# Patient Record
Sex: Male | Born: 2000 | Race: White | Hispanic: No | Marital: Single | Smoking: Current every day smoker
Health system: Southern US, Community
[De-identification: ages and names within clinical notes are randomized; demographics above are authoritative.]

## PROBLEM LIST (undated history)

## (undated) DIAGNOSIS — J302 Other seasonal allergic rhinitis: Secondary | ICD-10-CM

## (undated) DIAGNOSIS — F319 Bipolar disorder, unspecified: Secondary | ICD-10-CM

---
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
## 2018-01-26 ENCOUNTER — Emergency Department (HOSPITAL_BASED_OUTPATIENT_CLINIC_OR_DEPARTMENT_OTHER): Payer: PRIVATE HEALTH INSURANCE

## 2018-01-26 ENCOUNTER — Other Ambulatory Visit: Payer: Self-pay

## 2018-01-26 ENCOUNTER — Emergency Department (HOSPITAL_BASED_OUTPATIENT_CLINIC_OR_DEPARTMENT_OTHER)
Admission: EM | Admit: 2018-01-26 | Discharge: 2018-01-26 | Disposition: A | Discharge status: Home / Self Care | Payer: PRIVATE HEALTH INSURANCE | Attending: Physician Assistant | Admitting: Physician Assistant

## 2018-01-26 ENCOUNTER — Encounter (HOSPITAL_BASED_OUTPATIENT_CLINIC_OR_DEPARTMENT_OTHER): Payer: Self-pay

## 2018-01-26 DIAGNOSIS — F17210 Nicotine dependence, cigarettes, uncomplicated: Secondary | ICD-10-CM | POA: Diagnosis not present

## 2018-01-26 DIAGNOSIS — Z79899 Other long term (current) drug therapy: Secondary | ICD-10-CM | POA: Diagnosis not present

## 2018-01-26 DIAGNOSIS — R51 Headache: Secondary | ICD-10-CM | POA: Diagnosis not present

## 2018-01-26 DIAGNOSIS — R519 Headache, unspecified: Secondary | ICD-10-CM

## 2018-01-26 MED ORDER — SODIUM CHLORIDE 0.9 % IV BOLUS (SEPSIS)
1000.0000 mL | Freq: Once | INTRAVENOUS | Status: AC
  Administered 2018-01-26: 1000 mL via INTRAVENOUS

## 2018-01-26 MED ORDER — PROCHLORPERAZINE EDISYLATE 5 MG/ML IJ SOLN
10.0000 mg | Freq: Once | INTRAMUSCULAR | Status: AC
  Administered 2018-01-26: 10 mg via INTRAVENOUS
  Filled 2018-01-26: qty 2

## 2018-01-26 MED ORDER — DIPHENHYDRAMINE HCL 50 MG/ML IJ SOLN
25.0000 mg | Freq: Once | INTRAMUSCULAR | Status: AC
  Administered 2018-01-26: 25 mg via INTRAVENOUS
  Filled 2018-01-26: qty 1

## 2018-01-26 NOTE — ED Provider Notes (Signed)
[facility] EMERGENCY DEPARTMENT Provider Note   CSN: [id number] Arrival date & time: 01/26/18  1656     History   Chief Complaint Chief Complaint  Patient presents with  . Headache    HPI Ross Cooley is a 17 y.o. male.  HPI  Patient is an 17 year old male presenting with headache.  Patient has bipolar disorder.  Patient was unable to see his primary care so came here today.  Patient had a headache started this morning.  Has had headaches in the past.  He reports one is worse than usual.  Is located on his right eye.  He said it woke him up from sleep this morning.  He reports is been waxing and waning since then.  Was not related to exertion.   Past Medical History:  Diagnosis Date  . Bipolar 1 disorder (HCC)   . Seasonal allergies     There are no active problems to display for this patient.   History reviewed. No pertinent surgical history.     Home Medications    Prior to Admission medications   Medication Sig Start Date End Date Taking? Authorizing Provider  hydrOXYzine (ATARAX/VISTARIL) 25 MG tablet Take 1 tablet (25 mg total) by mouth every 6 (six) hours. Patient not taking: Reported on 10/30/2017 01/19/17   Bethel BornGekas, Kelly Marie, PA-C  lamoTRIgine (LAMICTAL) 100 MG tablet Take 50-100 mg See admin instructions by mouth. Take 1/2 tablet every morning and take 1 tablet at bedtime 10/24/17   [provider]  QUEtiapine (SEROQUEL XR) 300 MG 24 hr tablet Take 300 mg at bedtime by mouth. 09/25/17   [provider]  QUEtiapine (SEROQUEL) 25 MG tablet Take 25 mg daily as needed by mouth (for agitation).  10/24/17   [provider]    Family History No family history on file.  Social History Social History   Tobacco Use  . Smoking status: Current Every Day Smoker    Packs/day: 1.00    Types: Cigarettes  . Smokeless tobacco: Never Used  Substance Use Topics  . Alcohol use: No  . Drug use: No     Allergies   Patient  has no known allergies.   Review of Systems Review of Systems  Constitutional: Negative for activity change.  Respiratory: Negative for shortness of breath.   Cardiovascular: Negative for chest pain.  Gastrointestinal: Negative for abdominal pain.  Neurological: Positive for headaches. Negative for syncope and weakness.     Physical Exam Updated Vital Signs BP 108/81 (BP Location: Left Arm)   Pulse 75   Temp 98.3 F (36.8 C) (Oral)   Resp 18   Ht 5\' 8"  (1.727 m)   Wt 87.5 kg (192 lb 14.4 oz)   SpO2 96%   BMI 29.33 kg/m   Physical Exam  Constitutional: He is oriented to person, place, and time. He appears well-nourished.  HENT:  Head: Normocephalic.  Mouth/Throat: Oropharynx is clear and moist.  Eyes: Conjunctivae are normal.  Neck: No tracheal deviation present.  Cardiovascular: Normal rate.  Pulmonary/Chest: Effort normal. No stridor. No respiratory distress.  Abdominal: Soft. There is no tenderness. There is no guarding.  Musculoskeletal: Normal range of motion. He exhibits no edema.  Neurological: He is oriented to person, place, and time. No cranial nerve deficit.  Equal strength bilaterally upper and lower extremities n Normal sensation bilaterally. Speech comprehensible, no slurring. Facial nerve tested and appears grossly normal. Alert and oriented 3.   Skin: Skin is warm and dry. No  rash noted. He is not diaphoretic.  Psychiatric: He has a normal mood and affect. His behavior is normal.  Nursing note and vitals reviewed.    ED Treatments / Results  Labs (all labs ordered are listed, but only abnormal results are displayed) Labs Reviewed - No data to display  EKG  EKG Interpretation None       Radiology No results found.  Procedures Procedures (including critical care time)  Medications Ordered in ED Medications  sodium chloride 0.9 % bolus 1,000 mL (not administered)  prochlorperazine (COMPAZINE) injection 10 mg (not administered)    diphenhydrAMINE (BENADRYL) injection 25 mg (not administered)     Initial Impression / Assessment and Plan / ED Course  I have reviewed the triage vital signs and the nursing notes.  Pertinent labs & imaging results that were available during my care of the patient were reviewed by me and considered in my medical decision making (see chart for details).     Patient is an 17 year old male presenting with headache.  Patient has bipolar disorder.  Patient was unable to see his primary care so came here today.  Patient had a headache started this morning.  Has had headaches in the past.  He reports one is worse than usual.  Is located on his right eye.  He said it woke him up from sleep this morning.  He reports is been waxing and waning since then.  Was not related to exertion.   7:25 PM Will get CT.  Patient appears very comfortable.  Right now his headache is a 5-6 out of 10.  This would be very typical for subarachnoid hemorrhage.  In addition the waxing and waning pathology of this will be very atypical for subarachnoid hemorrhage.  We will get single CT.  We will give migraine medications.  Discussed subarachnoid hemorrhage and lumbar puncture.  Patient has very odd affect and difficulty making decisions with me.    I think given the atypical progress of symptoms we will not do a lumbar puncture at this time.   10:07 PM Went to [employer], patient ready to go. Tried to discuss with patient's father but he left the room. Both are anxious to leave. Patient's headahce now a .5 out of 10. Will have him keep daily headache journal and follow up with PCP.    Final Clinical Impressions(s) / ED Diagnoses   Final diagnoses:  None    ED Discharge Orders    None       Abelino Derrick, MD 01/26/18 2208

## 2018-01-26 NOTE — ED Notes (Signed)
Pt verbalizes understanding and denies any further needs at this time. 

## 2018-01-26 NOTE — ED Triage Notes (Signed)
C/o HA since 4am-denies injury-NAD-steady gait

## 2018-01-26 NOTE — Discharge Instructions (Signed)
Please keep a close eye on your headaches.  If you have any increasing headaches, increased pain, or any neurologic symptom,  please return immediately to the emergency department.  It is possible your headache was due to sinus fullness.  We want you to keep a daily record of your headaches and follow-up with your primary care.

## 2019-09-28 IMAGING — CT CT HEAD W/O CM
3 series · 14 of 47 positions shown, 16 images · non-contrast
Comparison: None.

CLINICAL DATA: Right-sided headache behind right eye.

EXAM:
CT HEAD WITHOUT CONTRAST
TECHNIQUE: Contiguous axial images were obtained from the base of the skull
through the vertex without intravenous contrast.

[Series 2: head wo · axial · 0.42mm/px · z∈[+821,+951]mm · 8 of 32 slices shown, 10 images]
[im 3/32  brain]
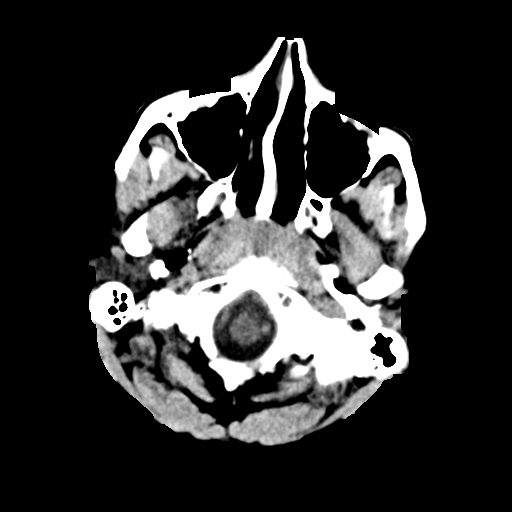
[im 3/32  bone]
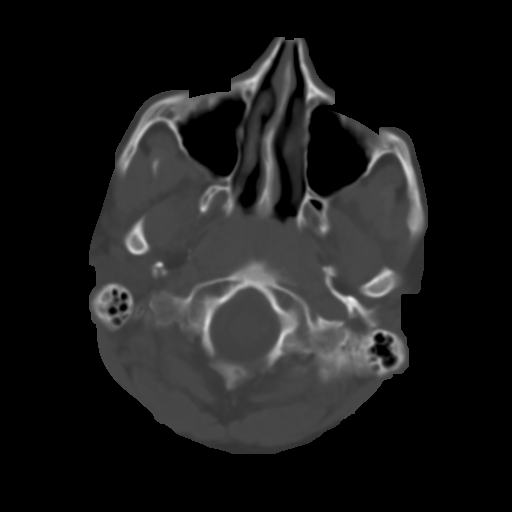
[im 7/32  brain]
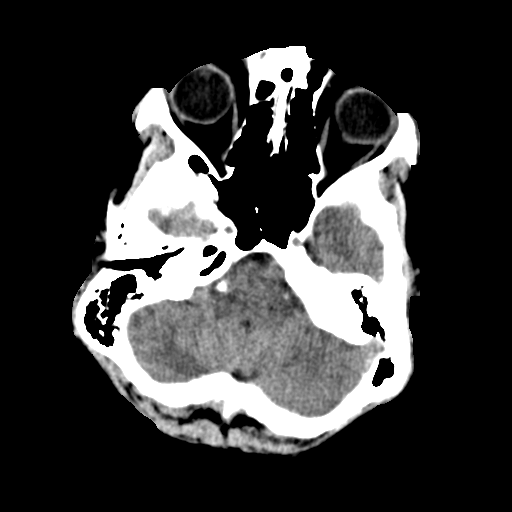
[im 10/32  brain]
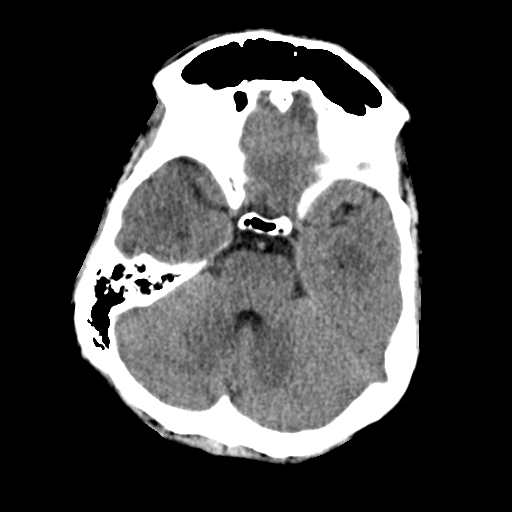
[im 14/32  brain]
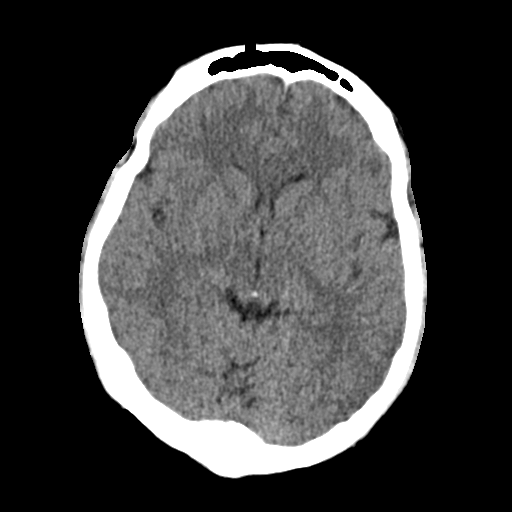
[im 18/32  brain]
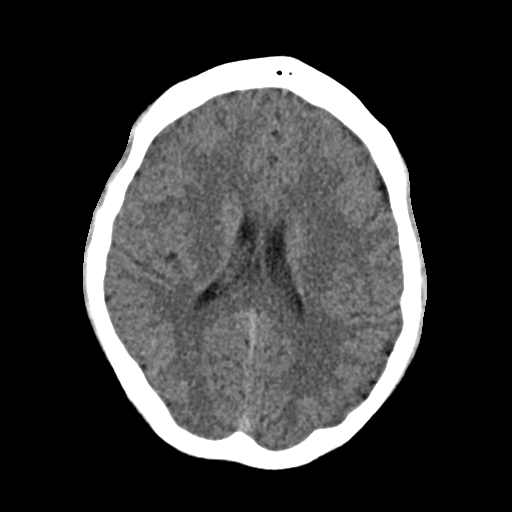
[im 18/32  bone]
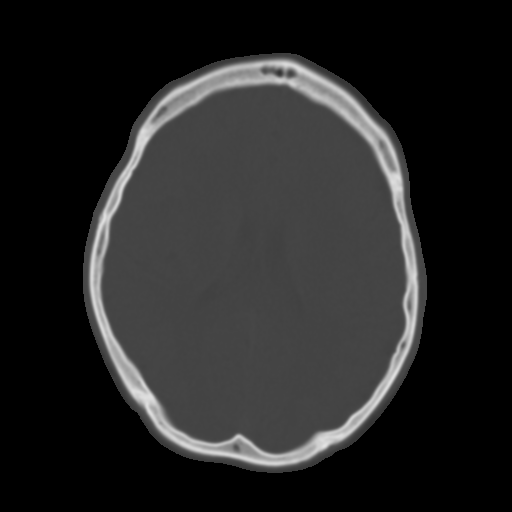
[im 22/32  brain]
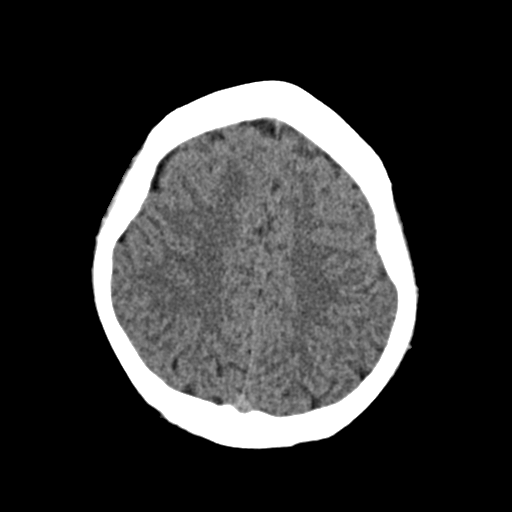
[im 25/32  brain]
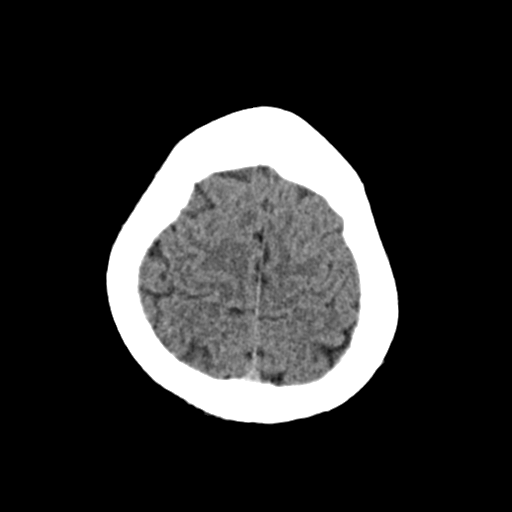
[im 29/32  brain]
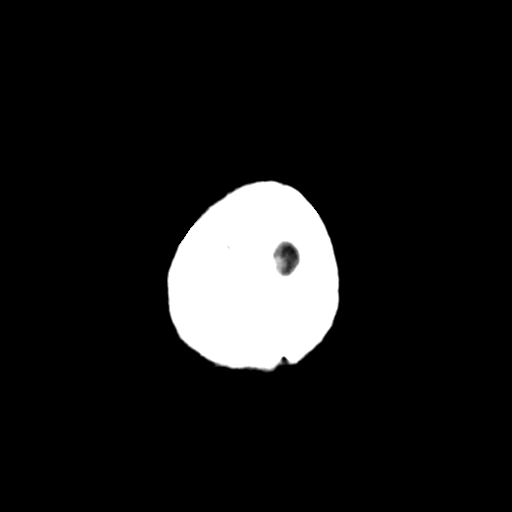

[Series 4: coronal soft · coronal · 0.32mm/px · 3 of 69 slices shown]
[im 23/69  brain]
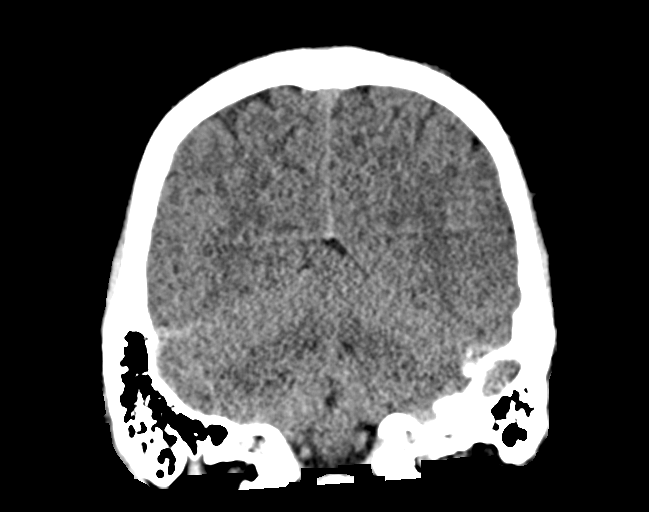
[im 31/69  brain]
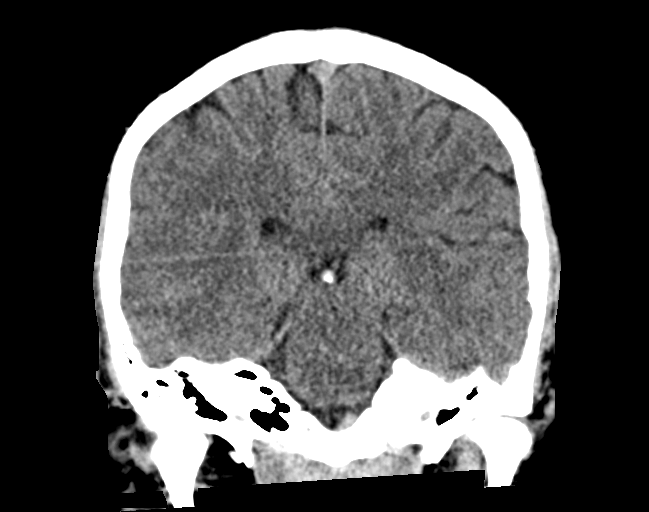
[im 38/69  brain]
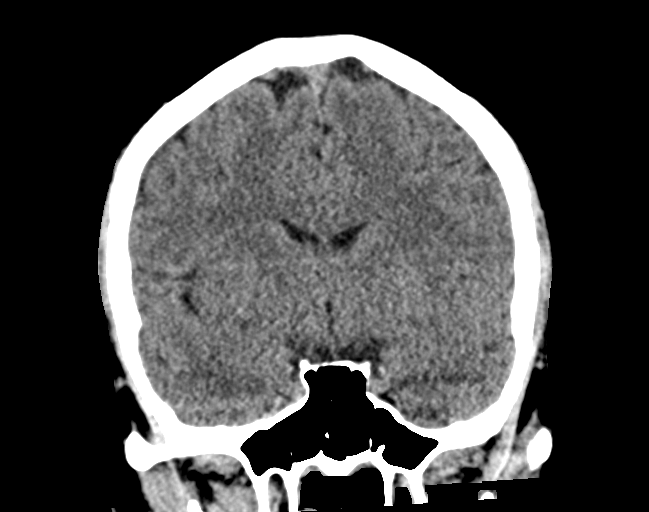

[Series 5: sag soft · sagittal · 0.30mm/px · 3 of 56 slices shown]
[im 19/56  brain]
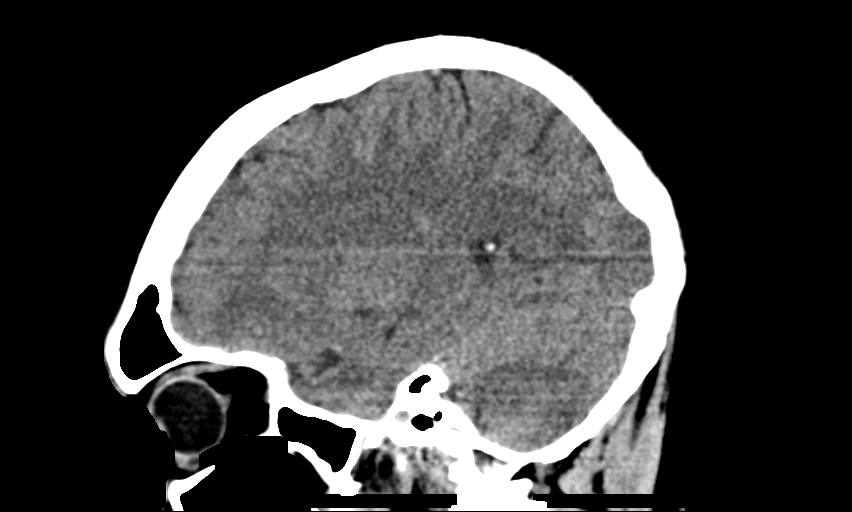
[im 28/56  brain]
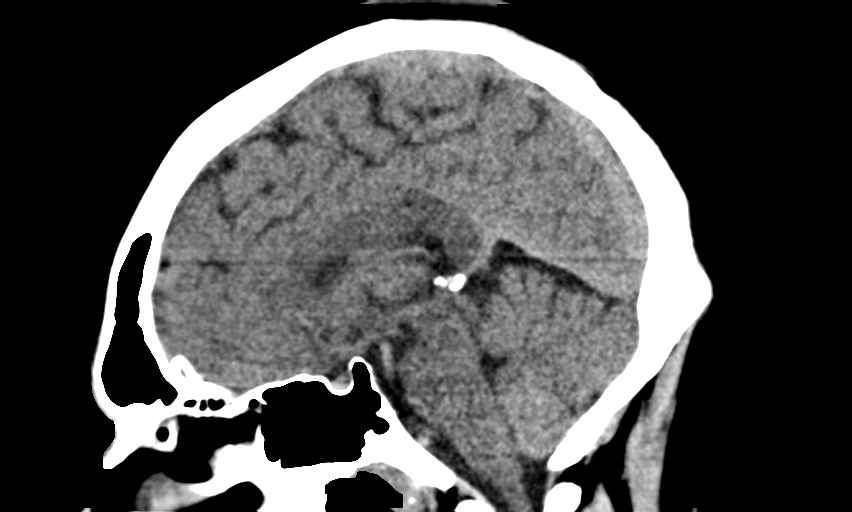
[im 37/56  brain]
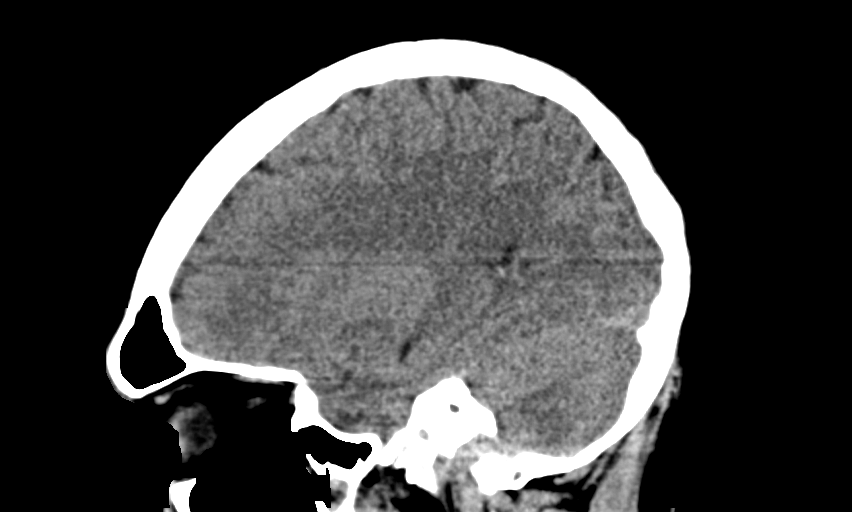

[14 of 47 positions shown; findings below may reference images not displayed]

FINDINGS: Brain: No evidence of acute infarction, hemorrhage, hydrocephalus,
extra-axial collection or mass lesion/mass effect.

Vascular: No hyperdense vessel or unexpected calcification.

Skull: Normal. Negative for fracture or focal lesion.

Sinuses/Orbits: Mild scattered mucosal thickening in the ethmoid air
cells. Paranasal sinuses, mastoid air cells, and middle ears are
otherwise normal.

Other: None.
IMPRESSION: 1. Mild scattered mucosal thickening in the ethmoid air cells. No
acute intracranial abnormality.

## 2020-12-31 ENCOUNTER — Emergency Department (HOSPITAL_COMMUNITY): Payer: Commercial Managed Care - PPO

## 2020-12-31 ENCOUNTER — Encounter (HOSPITAL_COMMUNITY): Payer: Self-pay

## 2020-12-31 ENCOUNTER — Emergency Department (HOSPITAL_COMMUNITY)
Admission: EM | Admit: 2020-12-31 | Discharge: 2020-12-31 | Disposition: A | Discharge status: Home / Self Care | Payer: Commercial Managed Care - PPO | Attending: Emergency Medicine | Admitting: Emergency Medicine

## 2020-12-31 ENCOUNTER — Other Ambulatory Visit: Payer: Self-pay

## 2020-12-31 DIAGNOSIS — R55 Syncope and collapse: Secondary | ICD-10-CM | POA: Diagnosis present

## 2020-12-31 DIAGNOSIS — F17210 Nicotine dependence, cigarettes, uncomplicated: Secondary | ICD-10-CM | POA: Diagnosis not present

## 2020-12-31 LAB — BASIC METABOLIC PANEL
Anion gap: 8 (ref 5–15)
BUN: 15 mg/dL (ref 6–20)
CO2: 26 mmol/L (ref 22–32)
Calcium: 9.3 mg/dL (ref 8.9–10.3)
Chloride: 104 mmol/L (ref 98–111)
Creatinine, Ser: 0.95 mg/dL (ref 0.61–1.24)
GFR, Estimated: >60 mL/min (ref >60)
Glucose, Bld: 92 mg/dL (ref 70–99)
Potassium: 3.9 mmol/L (ref 3.5–5.1)
Sodium: 138 mmol/L (ref 135–145)

## 2020-12-31 LAB — CBC WITH DIFFERENTIAL/PLATELET
Abs Immature Granulocytes: 0.05 10*3/uL (ref 0.00–0.07)
Basophils Absolute: 0 10*3/uL (ref 0.0–0.1)
Basophils Relative: 0 %
Eosinophils Absolute: 0.1 10*3/uL (ref 0.0–0.5)
Eosinophils Relative: 1 %
HCT: 40.7 % (ref 39.0–52.0)
Hemoglobin: 13.5 g/dL (ref 13.0–17.0)
Immature Granulocytes: 0 %
Lymphocytes Relative: 21 %
Lymphs Abs: 2.4 10*3/uL (ref 0.7–4.0)
MCH: 31.8 pg (ref 26.0–34.0)
MCHC: 33.2 g/dL (ref 30.0–36.0)
MCV: 95.8 fL (ref 80.0–100.0)
Monocytes Absolute: 0.8 10*3/uL (ref 0.1–1.0)
Monocytes Relative: 7 %
Neutro Abs: 8.1 10*3/uL — ABNORMAL HIGH (ref 1.7–7.7)
Neutrophils Relative %: 71 %
Platelets: 235 10*3/uL (ref 150–400)
RBC: 4.25 MIL/uL (ref 4.22–5.81)
RDW: 12.9 % (ref 11.5–15.5)
WBC: 11.3 10*3/uL — ABNORMAL HIGH (ref 4.0–10.5)
nRBC: 0 % (ref 0.0–0.2)

## 2020-12-31 LAB — TROPONIN I (HIGH SENSITIVITY): Troponin I (High Sensitivity): 13 ng/L (ref <18)

## 2020-12-31 NOTE — ED Triage Notes (Signed)
Pt arrived via walk in, states he  Does not remember fully, but was told, he passed out in car, EMS arrived, was not breathing, EMS did CPR on arrival. Pt states he took what he believed he took 30mg  oxy.

## 2020-12-31 NOTE — Discharge Instructions (Addendum)
Call your primary care doctor or specialist as discussed in the next 2-3 days.   Return immediately back to the ER if:  Your symptoms worsen within the next 12-24 hours. You develop new symptoms such as new fevers, persistent vomiting, new pain, shortness of breath, or new weakness or numbness, or if you have any other concerns.  

## 2020-12-31 NOTE — ED Provider Notes (Signed)
[facility] re[facility] facility. [facility]-EMERGENCY DEPT Provider Note   CSN: [id number] Arrival date & time: 12/31/20  1549     History No chief complaint on file.   Ross Cooley is a 20 y.o. male.  Patient presents to ER chief complaint of passing out.  Patient states that he does have a history of prescription drug abuse.  He had some oxycodone which he had snorted this prior to driving up to the gas station.  He had finished putting in his gas and sat back down in his car.  The next thing he knew he woke up with people surrounding him.  EMS reported that onlookers had performed CPR, however unclear whether this includes chest compressions or mouth-to-mouth or duration of allergic intervention.  Patient states that when he woke up he had no discomfort no headache no chest pain abdominal pain.  No recent history of fever vomiting cough or diarrhea.        Past Medical History:  Diagnosis Date  . Bipolar 1 disorder (HCC)   . Seasonal allergies     There are no problems to display for this patient.   History reviewed. No pertinent surgical history.     History reviewed. No pertinent family history.  Social History   Tobacco Use  . Smoking status: Current Every Day Smoker    Packs/day: 1.00    Types: Cigarettes  . Smokeless tobacco: Never Used  Substance Use Topics  . Alcohol use: No  . Drug use: No    Home Medications Prior to Admission medications   Medication Sig Start Date End Date Taking? Authorizing Provider  hydrOXYzine (ATARAX/VISTARIL) 25 MG tablet Take 1 tablet (25 mg total) by mouth every 6 (six) hours. Patient not taking: Reported on 10/30/2017 01/19/17   Bethel Born, PA-C  lamoTRIgine (LAMICTAL) 100 MG tablet Take 50-100 mg See admin instructions by mouth. Take 1/2 tablet every morning and take 1 tablet at bedtime 10/24/17   [provider]  QUEtiapine (SEROQUEL XR) 300 MG 24 hr tablet Take 300 mg at bedtime by mouth.  09/25/17   [provider]  QUEtiapine (SEROQUEL) 25 MG tablet Take 25 mg daily as needed by mouth (for agitation).  10/24/17   [provider]    Allergies    Patient has no known allergies.  Review of Systems   Review of Systems  Constitutional: Negative for fever.  HENT: Negative for ear pain and sore throat.   Eyes: Negative for pain.  Respiratory: Negative for cough.   Cardiovascular: Negative for chest pain.  Gastrointestinal: Negative for abdominal pain.  Genitourinary: Negative for flank pain.  Musculoskeletal: Negative for back pain.  Skin: Negative for color change and rash.  Neurological: Negative for syncope.  All other systems reviewed and are negative.   Physical Exam Updated Vital Signs BP 105/63   Pulse 75   Temp 97.6 F (36.4 C) (Oral)   Resp 18   SpO2 95%   Physical Exam Constitutional:      General: He is not in acute distress.    Appearance: He is well-developed.  HENT:     Head: Normocephalic.     Nose: Nose normal.  Eyes:     Extraocular Movements: Extraocular movements intact.  Cardiovascular:     Rate and Rhythm: Normal rate.  Pulmonary:     Effort: Pulmonary effort is normal.  Abdominal:     General: There is no distension.     Tenderness: There is  no abdominal tenderness.  Skin:    Coloration: Skin is not jaundiced.  Neurological:     General: No focal deficit present.     Mental Status: He is alert. Mental status is at baseline.     Cranial Nerves: No cranial nerve deficit.     Motor: No weakness.     Gait: Gait normal.     ED Results / Procedures / Treatments   Labs (all labs ordered are listed, but only abnormal results are displayed) Labs Reviewed  CBC WITH DIFFERENTIAL/PLATELET - Abnormal; Notable for the following components:      Result Value   WBC 11.3 (*)    Neutro Abs 8.1 (*)    All other components within normal limits  BASIC METABOLIC PANEL  TROPONIN I (HIGH SENSITIVITY)  TROPONIN I (HIGH  SENSITIVITY)    EKG None  Radiology DG Chest 2 View  Result Date: 12/31/2020 CLINICAL DATA:  Post CPR EXAM: CHEST - 2 VIEW COMPARISON:  None. FINDINGS: The heart size and mediastinal contours are within normal limits. Both lungs are clear. The visualized skeletal structures are unremarkable. IMPRESSION: No active cardiopulmonary disease. Electronically Signed   By: Jasmine Pang M.D.   On: 12/31/2020 19:15    Procedures Procedures (including critical care time)  Medications Ordered in ED Medications - No data to display  ED Course  I have reviewed the triage vital signs and the nursing notes.  Pertinent labs & imaging results that were available during my care of the patient were reviewed by me and considered in my medical decision making (see chart for details).    MDM Rules/Calculators/A&P                          Father was at bedside.  Denies any family history of sudden death.  Labs reviewed and was unremarkable.  Patient continues have no additional adverse events.  Father is at bedside and they are comfortable following up I advised immediate return if he has difficulty breathing pains or any additional concerns otherwise follow-up in drug rehab within the week.  Patient and family stressed understanding, discharged home in stable condition.   Final Clinical Impression(s) / ED Diagnoses Final diagnoses:  Syncope, unspecified syncope type    Rx / DC Orders ED Discharge Orders    None       Cheryll Cockayne, MD 12/31/20 1941

## 2021-01-28 ENCOUNTER — Encounter (HOSPITAL_COMMUNITY): Payer: Self-pay

## 2021-01-28 ENCOUNTER — Emergency Department (HOSPITAL_COMMUNITY)
Admission: EM | Admit: 2021-01-28 | Discharge: 2021-01-28 | Disposition: A | Discharge status: Home / Self Care | Payer: Commercial Managed Care - PPO | Attending: Emergency Medicine | Admitting: Emergency Medicine

## 2021-01-28 DIAGNOSIS — F17210 Nicotine dependence, cigarettes, uncomplicated: Secondary | ICD-10-CM | POA: Diagnosis not present

## 2021-01-28 DIAGNOSIS — R21 Rash and other nonspecific skin eruption: Secondary | ICD-10-CM | POA: Diagnosis present

## 2021-01-28 DIAGNOSIS — L509 Urticaria, unspecified: Secondary | ICD-10-CM | POA: Insufficient documentation

## 2021-01-28 MED ORDER — EPINEPHRINE 0.3 MG/0.3ML IJ SOAJ: 0.3000 mg | INTRAMUSCULAR | 0 refills | Status: AC | PRN

## 2021-01-28 MED ORDER — FAMOTIDINE 20 MG PO TABS: 20.0000 mg | ORAL_TABLET | Freq: Every day | ORAL | 0 refills | Status: AC

## 2021-01-28 MED ORDER — DIPHENHYDRAMINE HCL 25 MG PO TABS: 25.0000 mg | ORAL_TABLET | Freq: Four times a day (QID) | ORAL | 0 refills | Status: AC

## 2021-01-28 MED ORDER — FAMOTIDINE 20 MG PO TABS
20.0000 mg | ORAL_TABLET | Freq: Once | ORAL | Status: AC
  Administered 2021-01-28: 20 mg via ORAL
  Filled 2021-01-28: qty 1

## 2021-01-28 NOTE — ED Triage Notes (Signed)
Pt reports full body rash/hives that woke him up this morning. Pt took 25mg  benadryl PTA, c/o itching and discomfort, denies any respiratory symptoms

## 2021-01-28 NOTE — ED Notes (Signed)
Patient given drinks.  

## 2021-01-28 NOTE — ED Provider Notes (Signed)
[facility]-EMERGENCY DEPT Provider Note  CSN: [id number] Arrival date & time: 01/28/21 7510  Chief Complaint(s) Rash  HPI Ross Cooley is a 20 y.o. male    Rash Location:  Full body Quality: itchiness and redness   Severity:  Moderate Onset quality:  Gradual Duration:  1 day Timing:  Constant Progression:  Spreading Chronicity:  New Context comment:  Possible detergent or cleaning products. His GF moved into new apartement and he spent the night there last night. Relieved by:  Antihistamines Worsened by:  Nothing Associated symptoms: no fever, no headaches, no hoarse voice, no nausea, no periorbital edema, no shortness of breath, no throat swelling, no tongue swelling, not vomiting and not wheezing     Past Medical History Past Medical History:  Diagnosis Date  . Bipolar 1 disorder (HCC)   . Seasonal allergies    There are no problems to display for this patient.  Home Medication(s) Prior to Admission medications   Medication Sig Start Date End Date Taking? Authorizing Provider  diphenhydrAMINE (BENADRYL) 25 MG tablet Take 1 tablet (25 mg total) by mouth every 6 (six) hours for 5 days. 01/28/21 02/02/21 Yes Sylvi Rybolt, Amadeo Garnet, MD  EPINEPHrine 0.3 mg/0.3 mL IJ SOAJ injection Inject 0.3 mg into the muscle as needed for anaphylaxis. 01/28/21  Yes Coltyn Hanning, Amadeo Garnet, MD  famotidine (PEPCID) 20 MG tablet Take 1 tablet (20 mg total) by mouth daily for 5 days. 01/28/21 02/02/21 Yes Sobia Karger, Amadeo Garnet, MD  hydrOXYzine (ATARAX/VISTARIL) 25 MG tablet Take 1 tablet (25 mg total) by mouth every 6 (six) hours. Patient not taking: Reported on 10/30/2017 01/19/17   Bethel Born, PA-C  lamoTRIgine (LAMICTAL) 100 MG tablet Take 50-100 mg See admin instructions by mouth. Take 1/2 tablet every morning and take 1 tablet at bedtime 10/24/17   [provider]  QUEtiapine (SEROQUEL XR) 300 MG 24 hr tablet Take 300 mg at bedtime by mouth. 09/25/17   [provider]  QUEtiapine (SEROQUEL) 25 MG tablet Take 25 mg daily as needed by mouth (for agitation).  10/24/17   [provider]                                                                                                                                    Past Surgical History History reviewed. No pertinent surgical history. Family History History reviewed. No pertinent family history.  Social History Social History   Tobacco Use  . Smoking status: Current Every Day Smoker    Packs/day: 1.00    Types: Cigarettes  . Smokeless tobacco: Never Used  Substance Use Topics  . Alcohol use: No  . Drug use: No   Allergies Patient has no known allergies.  Review of Systems Review of Systems  Constitutional: Negative for fever.  HENT: Negative for hoarse voice.   Respiratory: Negative for shortness of breath and wheezing.   Gastrointestinal: Negative for nausea  and vomiting.  Skin: Positive for rash.  Neurological: Negative for headaches.   All other systems are reviewed and are negative for acute change except as noted in the HPI  Physical Exam Vital Signs  I have reviewed the triage vital signs BP 110/75   Pulse 61   Temp 97.7 F (36.5 C) (Oral)   Resp 15   Ht 5\' 8"  (1.727 m)   Wt 72.6 kg   SpO2 98%   BMI 24.33 kg/m   Physical Exam Vitals reviewed.  Constitutional:      General: He is not in acute distress.    Appearance: He is well-developed and well-nourished. He is not diaphoretic.  HENT:     Head: Normocephalic and atraumatic.     Nose: Nose normal.  Eyes:     General: No scleral icterus.       Right eye: No discharge.        Left eye: No discharge.     Extraocular Movements: EOM normal.     Conjunctiva/sclera: Conjunctivae normal.     Pupils: Pupils are equal, round, and reactive to light.  Cardiovascular:     Rate and Rhythm: Normal rate and regular rhythm.     Heart sounds: No murmur heard. No friction rub. No gallop.   Pulmonary:      Effort: Pulmonary effort is normal. No respiratory distress.     Breath sounds: Normal breath sounds. No stridor. No rales.  Abdominal:     General: There is no distension.     Palpations: Abdomen is soft.     Tenderness: There is no abdominal tenderness.  Musculoskeletal:        General: No tenderness or edema.     Cervical back: Normal range of motion and neck supple.  Skin:    General: Skin is warm and dry.     Findings: Rash present. No erythema. Rash is urticarial (mild, throughout torso, BUE and BLE).  Neurological:     Mental Status: He is alert and oriented to person, place, and time.  Psychiatric:        Mood and Affect: Mood and affect normal.     ED Results and Treatments Labs (all labs ordered are listed, but only abnormal results are displayed) Labs Reviewed - No data to display                                                                                                                       EKG  EKG Interpretation  Date/Time:    Ventricular Rate:    PR Interval:    QRS Duration:   QT Interval:    QTC Calculation:   R Axis:     Text Interpretation:        Radiology No results found.  Pertinent labs & imaging results that were available during my care of the patient were reviewed by me and considered in my medical decision making (see chart for details).  Medications Ordered in ED Medications  famotidine (PEPCID) tablet 20 mg (has no administration in time range)                                                                                                                                    Procedures Procedures  (including critical care time)  Medical Decision Making / ED Course I have reviewed the nursing notes for this encounter and the patient's prior records (if available in EHR or on provided paperwork).   ZEBBIE ACE was evaluated in Emergency Department on 01/28/2021 for the symptoms described in the history of present illness. He was  evaluated in the context of the global COVID-19 pandemic, which necessitated consideration that the patient might be at risk for infection with the SARS-CoV-2 virus that causes COVID-19. Institutional protocols and algorithms that pertain to the evaluation of patients at risk for COVID-19 are in a state of rapid change based on information released by regulatory bodies including the CDC and federal and state organizations. These policies and algorithms were followed during the patient's care in the ED.  20 y.o. male here with pruritic rash. No known triggers or exposures. No respiratory, GI, or neurologic symptoms to suggest anaphylaxis. NO recent infectious symptoms suggestive of viral urticaria.  Patient has taken benadryl prior to arrival.   On exam, there is no evidence of oral swelling or airway compromise.   Given H2 blocker. Monitored for 2 additional hours. Symptoms without progression.  Safe for discharge with strict return precautions. Given Rx for H2 blocker.       Final Clinical Impression(s) / ED Diagnoses Final diagnoses:  Urticaria   The patient appears reasonably screened and/or stabilized for discharge and I doubt any other medical condition or other [facility] requiring further screening, evaluation, or treatment in the ED at this time prior to discharge. Safe for discharge with strict return precautions.  Disposition: Discharge  Condition: Good  I have discussed the results, Dx and Tx plan with the patient/family who expressed understanding and agree(s) with the plan. Discharge instructions discussed at length. The patient/family was given strict return precautions who verbalized understanding of the instructions. No further questions at time of discharge.    ED Discharge Orders         Ordered    famotidine (PEPCID) 20 MG tablet  Daily        01/28/21 0535    diphenhydrAMINE (BENADRYL) 25 MG tablet  Every 6 hours        01/28/21 0535    EPINEPHrine 0.3 mg/0.3 mL IJ SOAJ  injection  As needed        01/28/21 0535            Follow Up: Primary care provider  Call  To schedule an appointment for close follow up      This chart was dictated using voice recognition software.  Despite best efforts to proofread,  errors can occur which can  change the documentation meaning.   Nira Conn, MD 01/28/21 [phone]

## 2022-09-02 IMAGING — CR DG CHEST 2V
2 series · 2 of 2 positions shown · non-contrast
Comparison: None.

CLINICAL DATA: Post CPR

EXAM:
CHEST - 2 VIEW

[w chest pa]
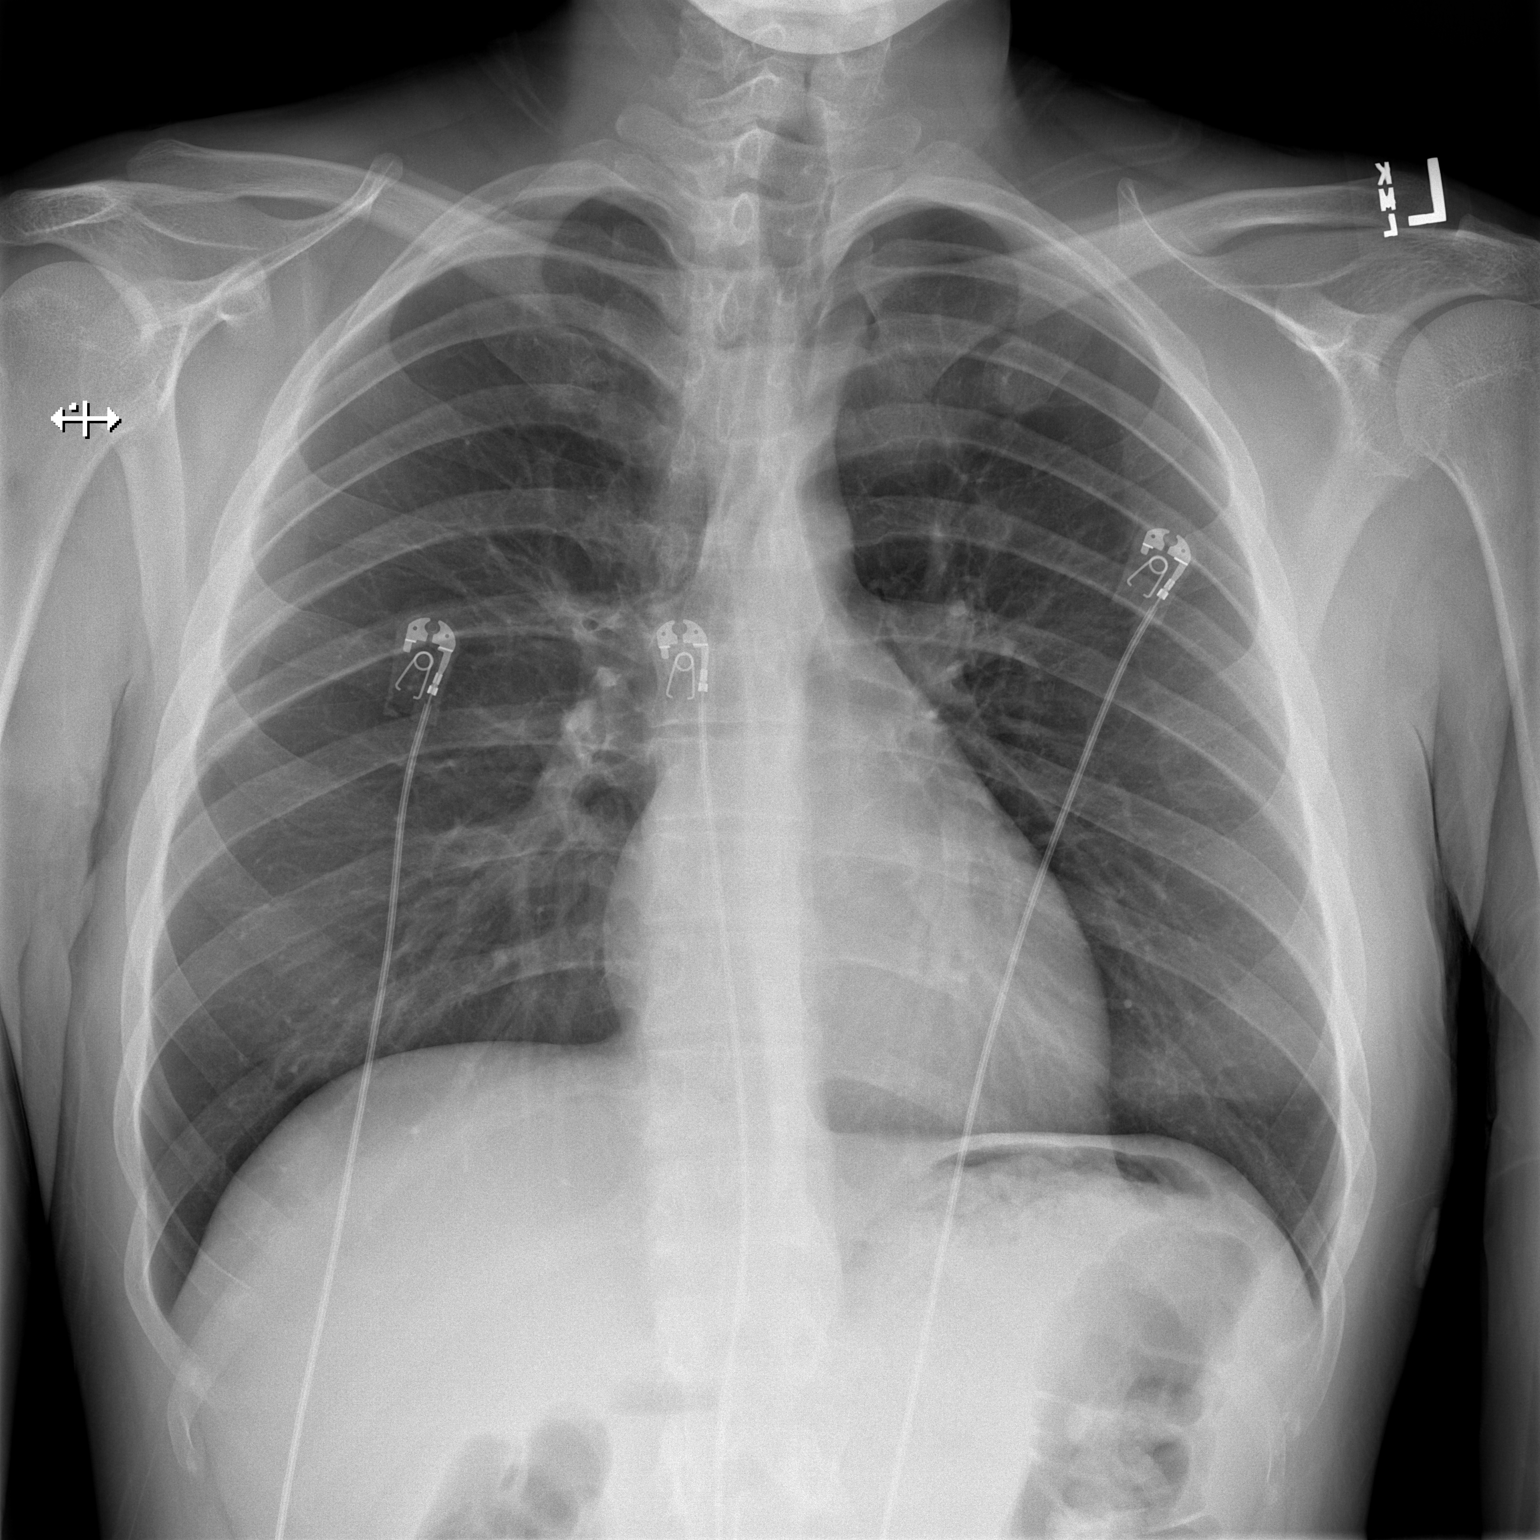

[w chest lat]
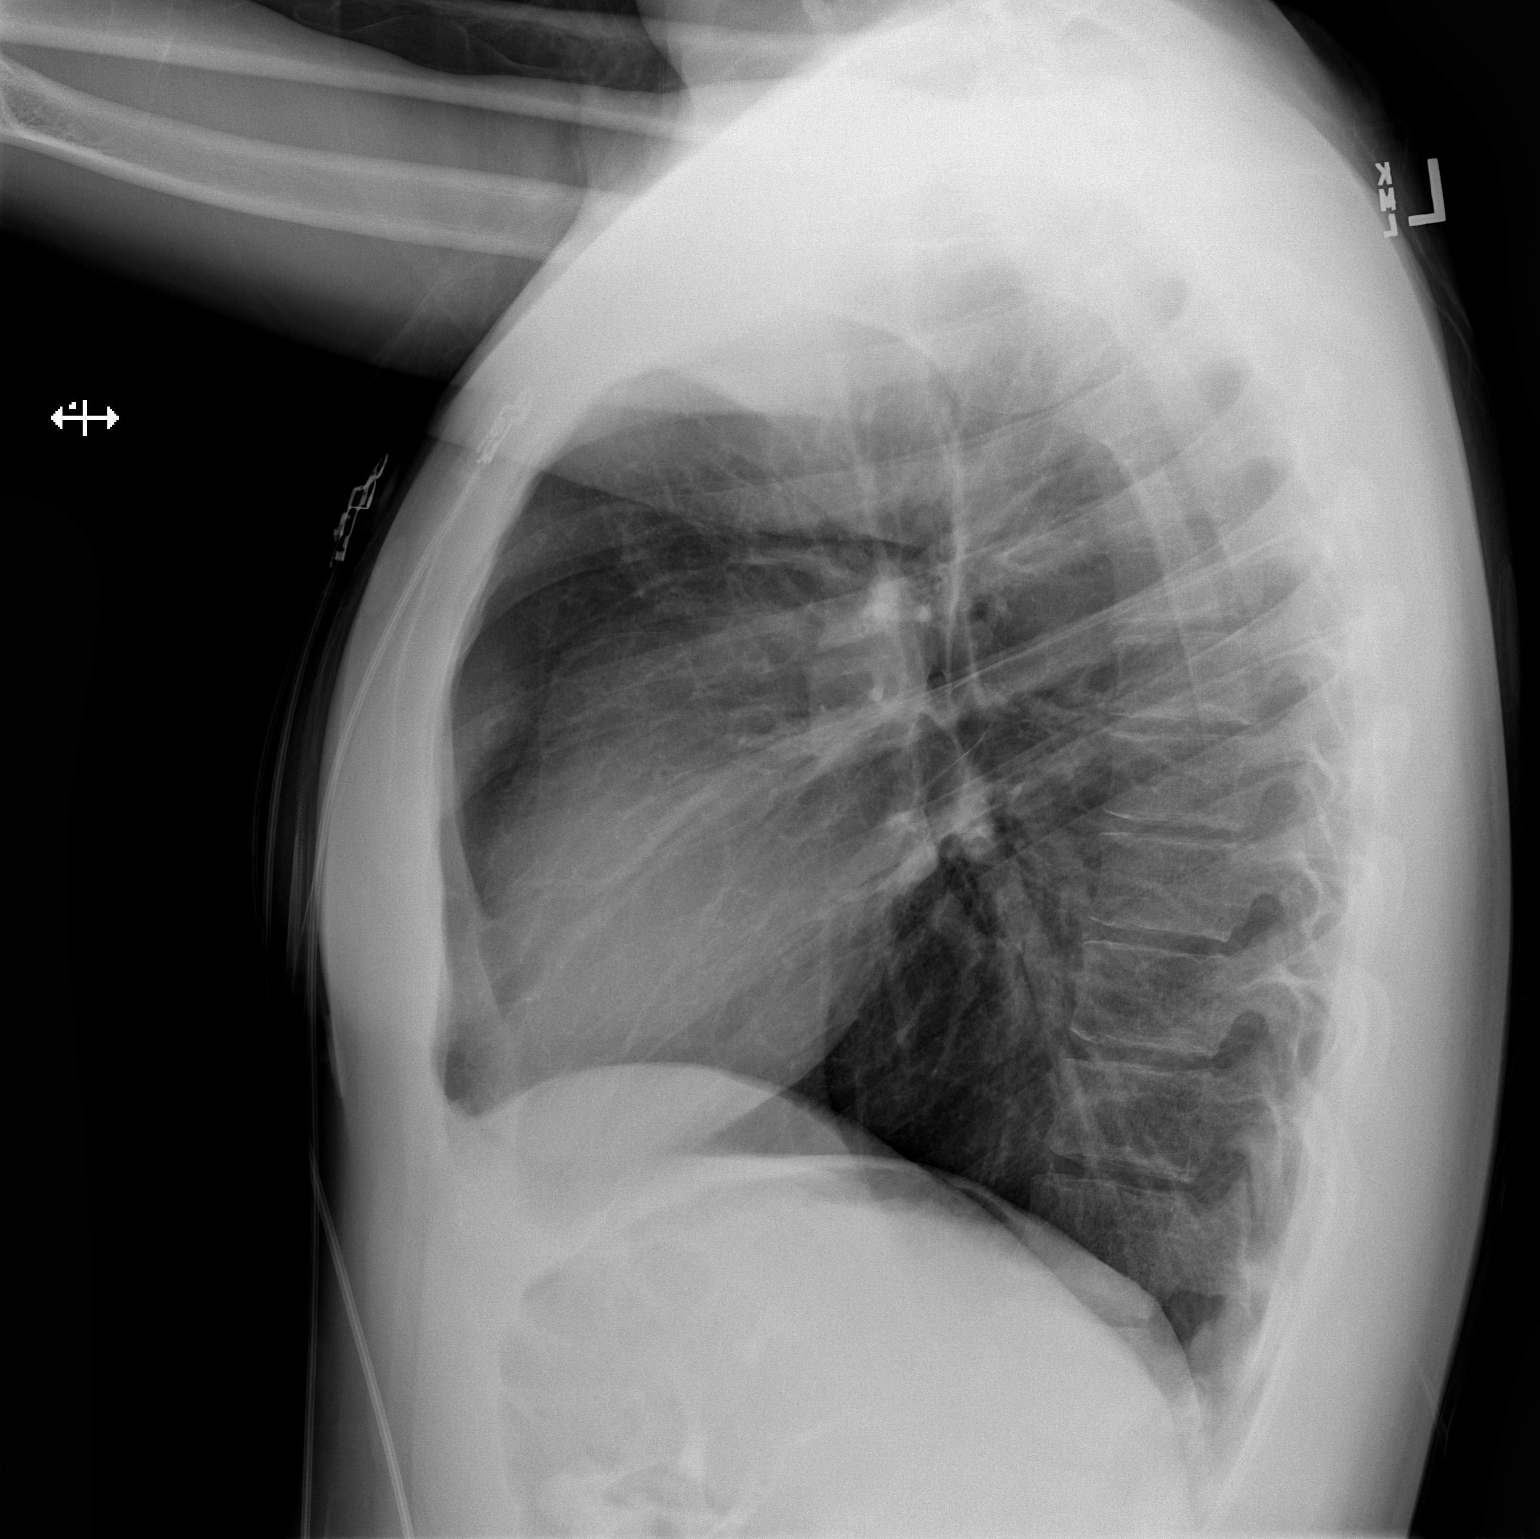

[2 of 2 positions shown; findings below may reference images not displayed]

FINDINGS: The heart size and mediastinal contours are within normal limits.
Both lungs are clear. The visualized skeletal structures are
unremarkable.
IMPRESSION: No active cardiopulmonary disease.

## 2023-01-30 ENCOUNTER — Other Ambulatory Visit: Payer: Self-pay

## 2023-01-30 ENCOUNTER — Emergency Department (HOSPITAL_BASED_OUTPATIENT_CLINIC_OR_DEPARTMENT_OTHER)
Admission: EM | Admit: 2023-01-30 | Discharge: 2023-01-30 | Disposition: A | Discharge status: Home / Self Care | Payer: Commercial Managed Care - PPO | Attending: Emergency Medicine | Admitting: Emergency Medicine

## 2023-01-30 ENCOUNTER — Encounter (HOSPITAL_BASED_OUTPATIENT_CLINIC_OR_DEPARTMENT_OTHER): Payer: Self-pay

## 2023-01-30 DIAGNOSIS — R251 Tremor, unspecified: Secondary | ICD-10-CM | POA: Diagnosis present

## 2023-01-30 DIAGNOSIS — F1113 Opioid abuse with withdrawal: Secondary | ICD-10-CM | POA: Insufficient documentation

## 2023-01-30 DIAGNOSIS — F1193 Opioid use, unspecified with withdrawal: Secondary | ICD-10-CM

## 2023-01-30 LAB — CBC WITH DIFFERENTIAL/PLATELET
Abs Immature Granulocytes: 0.03 10*3/uL (ref 0.00–0.07)
Basophils Absolute: 0 10*3/uL (ref 0.0–0.1)
Basophils Relative: 0 %
Eosinophils Absolute: 0.1 10*3/uL (ref 0.0–0.5)
Eosinophils Relative: 1 %
HCT: 37.1 % — ABNORMAL LOW (ref 39.0–52.0)
Hemoglobin: 13.2 g/dL (ref 13.0–17.0)
Immature Granulocytes: 0 %
Lymphocytes Relative: 19 %
Lymphs Abs: 1.8 10*3/uL (ref 0.7–4.0)
MCH: 31.1 pg (ref 26.0–34.0)
MCHC: 35.6 g/dL (ref 30.0–36.0)
MCV: 87.5 fL (ref 80.0–100.0)
Monocytes Absolute: 0.3 10*3/uL (ref 0.1–1.0)
Monocytes Relative: 4 %
Neutro Abs: 7.1 10*3/uL (ref 1.7–7.7)
Neutrophils Relative %: 76 %
Platelets: 384 10*3/uL (ref 150–400)
RBC: 4.24 MIL/uL (ref 4.22–5.81)
RDW: 12.7 % (ref 11.5–15.5)
WBC: 9.4 10*3/uL (ref 4.0–10.5)
nRBC: 0 % (ref 0.0–0.2)

## 2023-01-30 LAB — COMPREHENSIVE METABOLIC PANEL
ALT: 13 U/L (ref 0–44)
AST: 13 U/L — ABNORMAL LOW (ref 15–41)
Albumin: 4.9 g/dL (ref 3.5–5.0)
Alkaline Phosphatase: 50 U/L (ref 38–126)
Anion gap: 10 (ref 5–15)
BUN: 13 mg/dL (ref 6–20)
CO2: 26 mmol/L (ref 22–32)
Calcium: 10.3 mg/dL (ref 8.9–10.3)
Chloride: 98 mmol/L (ref 98–111)
Creatinine, Ser: 0.69 mg/dL (ref 0.61–1.24)
GFR, Estimated: >60 mL/min (ref >60)
Glucose, Bld: 113 mg/dL — ABNORMAL HIGH (ref 70–99)
Potassium: 3.8 mmol/L (ref 3.5–5.1)
Sodium: 134 mmol/L — ABNORMAL LOW (ref 135–145)
Total Bilirubin: 0.5 mg/dL (ref 0.3–1.2)
Total Protein: 8.3 g/dL — ABNORMAL HIGH (ref 6.5–8.1)

## 2023-01-30 LAB — RAPID URINE DRUG SCREEN, HOSP PERFORMED
Amphetamines: NOT DETECTED
Barbiturates: NOT DETECTED
Benzodiazepines: NOT DETECTED
Cocaine: NOT DETECTED
Opiates: POSITIVE — AB
Tetrahydrocannabinol: POSITIVE — AB

## 2023-01-30 LAB — ETHANOL: Alcohol, Ethyl (B): <10 mg/dL (ref <10)

## 2023-01-30 MED ORDER — CLONIDINE HCL 0.1 MG PO TABS: 0.1000 mg | ORAL_TABLET | Freq: Two times a day (BID) | ORAL | Status: DC

## 2023-01-30 MED ORDER — ONDANSETRON 4 MG PO TBDP
4.0000 mg | ORAL_TABLET | Freq: Four times a day (QID) | ORAL | Status: DC | PRN
  Administered 2023-01-30: 4 mg via ORAL
  Filled 2023-01-30: qty 1

## 2023-01-30 MED ORDER — CLONIDINE HCL 0.1 MG PO TABS
0.1000 mg | ORAL_TABLET | Freq: Four times a day (QID) | ORAL | Status: DC
  Administered 2023-01-30: 0.1 mg via ORAL
  Filled 2023-01-30: qty 1

## 2023-01-30 MED ORDER — ONDANSETRON HCL 4 MG PO TABS: 4.0000 mg | ORAL_TABLET | Freq: Four times a day (QID) | ORAL | 0 refills | Status: AC

## 2023-01-30 MED ORDER — CLONIDINE HCL 0.2 MG PO TABS: 0.2000 mg | ORAL_TABLET | Freq: Two times a day (BID) | ORAL | 0 refills | Status: AC

## 2023-01-30 MED ORDER — DICYCLOMINE HCL 10 MG PO CAPS
20.0000 mg | ORAL_CAPSULE | Freq: Four times a day (QID) | ORAL | Status: DC | PRN
  Administered 2023-01-30: 20 mg via ORAL
  Filled 2023-01-30: qty 2

## 2023-01-30 MED ORDER — CLONIDINE HCL 0.1 MG PO TABS: 0.1000 mg | ORAL_TABLET | Freq: Every day | ORAL | Status: DC

## 2023-01-30 MED ORDER — NAPROXEN 250 MG PO TABS: 500.0000 mg | ORAL_TABLET | Freq: Two times a day (BID) | ORAL | Status: DC | PRN

## 2023-01-30 MED ORDER — HYDROXYZINE HCL 25 MG PO TABS
25.0000 mg | ORAL_TABLET | Freq: Four times a day (QID) | ORAL | Status: DC | PRN
  Administered 2023-01-30: 25 mg via ORAL
  Filled 2023-01-30: qty 1

## 2023-01-30 MED ORDER — SODIUM CHLORIDE 0.9 % IV BOLUS
1000.0000 mL | Freq: Once | INTRAVENOUS | Status: AC
  Administered 2023-01-30: 1000 mL via INTRAVENOUS

## 2023-01-30 MED ORDER — METHOCARBAMOL 500 MG PO TABS: 500.0000 mg | ORAL_TABLET | Freq: Three times a day (TID) | ORAL | Status: DC | PRN

## 2023-01-30 MED ORDER — LOPERAMIDE HCL 2 MG PO CAPS: 2.0000 mg | ORAL_CAPSULE | ORAL | Status: DC | PRN

## 2023-01-30 NOTE — Discharge Instructions (Addendum)
Evaluation today revealed that your likely an opioid withdrawal.  I am encouraged to know that you feel much better after treatment.  I sent clonidine to help treat your opiate withdrawal.  Also sent Zofran to help with any nausea and vomiting that may occur which is common with withdrawal.  Otherwise I strongly encourage you to go to rehab with your brother tomorrow.  If you have new shortness of breath, chest pain, altered mental status, loss of consciousness, calf tenderness or abdominal pain or any other concerning symptom please return to the emergency department for further evaluation.

## 2023-01-30 NOTE — ED Notes (Signed)
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 Pt also smokes marijuana. Pt drank alcohol x1 in the last 3-4 months. Pt reports he did drink daily, last alcoholic drink was 3-4 months ago  Pt took 50-60 mg Methadone approx 11am. Pt is scheduled to attend [facility] facility tomorrow morning. Pt is going voluntarily tomorrow morning.   Pt denies HI/SI

## 2023-01-30 NOTE — ED Provider Notes (Signed)
[facility] Provider Note   CSN: [id number] Arrival date & time: 01/30/23  1413     History  Chief Complaint  Patient presents with   Nasal Congestion   HPI Ross Cooley is a 22 y.o. male with bipolar 1, anxiety, opioid abuse presenting for body tremors.  Started about a month ago.  Tremors last a few minutes and his whole body.  Usually by stress and/or anxiety.  Denies chest pain shortness of breath or abdominal pain.  States that this morning he snorted fentanyl around 4 AM and later methadone at 11 AM.  States he is planning to register for rehab with his brother tomorrow.  Overall concerned that the body shakes after doing some sort of electrolyte imbalance which is concerning to him.  Endorses nausea but no vomiting or diarrhea.  States he and his brother have been exposed to opiates at a young age.   HPI     Home Medications Prior to Admission medications   Medication Sig Start Date End Date Taking? Authorizing Provider  cloNIDine (CATAPRES) 0.2 MG tablet Take 1 tablet (0.2 mg total) by mouth 2 (two) times daily. 01/30/23 03/01/23 Yes Harriet Pho, PA-C  ondansetron (ZOFRAN) 4 MG tablet Take 1 tablet (4 mg total) by mouth every 6 (six) hours. 01/30/23  Yes Harriet Pho, PA-C  sertraline (ZOLOFT) 100 MG tablet Take 150 mg by mouth daily.   Yes [provider]  diphenhydrAMINE (BENADRYL) 25 MG tablet Take 1 tablet (25 mg total) by mouth every 6 (six) hours for 5 days. 01/28/21 02/02/21  Fatima Blank, MD  EPINEPHrine 0.3 mg/0.3 mL IJ SOAJ injection Inject 0.3 mg into the muscle as needed for anaphylaxis. 01/28/21   Fatima Blank, MD  famotidine (PEPCID) 20 MG tablet Take 1 tablet (20 mg total) by mouth daily for 5 days. 01/28/21 02/02/21  Fatima Blank, MD  hydrOXYzine (ATARAX/VISTARIL) 25 MG tablet Take 1 tablet (25 mg total) by mouth every 6 (six) hours. Patient not taking: Reported on 10/30/2017 01/19/17    Recardo Evangelist, PA-C      Allergies    Patient has no known allergies.    Review of Systems   Review of Systems  Constitutional:        Body shakes    Physical Exam   Vitals:   01/30/23 1430 01/30/23 1730  BP: (!) 165/102 102/63  Pulse: (!) 114 82  Resp: 20 17  Temp: 97.8 F (36.6 C) 98 F (36.7 C)  SpO2: 99% 99%    CONSTITUTIONAL:  ill-appearing, NAD, diaphoretic NEURO:  Alert and oriented x 3, CN 3-12 grossly intact EYES:  eyes dilated equal and reactive ENT/NECK:  Supple, no stridor CARDIO:  tachycardic, regular rhythm, appears well-perfused  PULM:  No respiratory distress,  GI/GU:  non-distended, soft MSK/SPINE:  No gross deformities, no edema, moves all extremities  SKIN:  no rash, atraumatic   *Additional and/or pertinent findings included in MDM below     ED Results / Procedures / Treatments   Labs (all labs ordered are listed, but only abnormal results are displayed) Labs Reviewed  COMPREHENSIVE METABOLIC PANEL - Abnormal; Notable for the following components:      Result Value   Sodium 134 (*)    Glucose, Bld 113 (*)    Total Protein 8.3 (*)    AST 13 (*)    All other components within normal limits  RAPID URINE DRUG SCREEN, HOSP  PERFORMED - Abnormal; Notable for the following components:   Opiates POSITIVE (*)    Tetrahydrocannabinol POSITIVE (*)    All other components within normal limits  CBC WITH DIFFERENTIAL/PLATELET - Abnormal; Notable for the following components:   HCT 37.1 (*)    All other components within normal limits  ETHANOL    EKG None  Radiology No results found.  Procedures Procedures    Medications Ordered in ED Medications  cloNIDine (CATAPRES) tablet 0.1 mg (0.1 mg Oral Given 01/30/23 1658)    Followed by  cloNIDine (CATAPRES) tablet 0.1 mg (has no administration in time range)    Followed by  cloNIDine (CATAPRES) tablet 0.1 mg (has no administration in time range)  dicyclomine (BENTYL) capsule 20 mg (20 mg  Oral Given 01/30/23 1658)  hydrOXYzine (ATARAX) tablet 25 mg (25 mg Oral Given 01/30/23 1657)  loperamide (IMODIUM) capsule 2-4 mg (has no administration in time range)  methocarbamol (ROBAXIN) tablet 500 mg (has no administration in time range)  naproxen (NAPROSYN) tablet 500 mg (has no administration in time range)  ondansetron (ZOFRAN-ODT) disintegrating tablet 4 mg (4 mg Oral Given 01/30/23 1657)  sodium chloride 0.9 % bolus 1,000 mL (0 mLs Intravenous Stopped 01/30/23 1824)    ED Course/ Medical Decision Making/ A&P                             Medical Decision Making Amount and/or Complexity of Data Reviewed Labs: ordered.  Risk Prescription drug management.   Initial Impression and Ddx 22 year old male who is ill-appearing who is tachycardic and hypertensive presenting for body tremors. Exam notable for dilated pupils, elevated blood pressure, tachycardia and diaphoresis.  DDx includes opiate withdrawal, electrolyte derangement, seizure, and sepsis. Patient PMH that increases complexity of ED encounter: Long history of opiate dependence  Interpretation of Diagnostics I independent reviewed and interpreted the labs as followed: Urine was positive for opiates and THC  I independently reviewed and interpreted EKG which revealed sinus rhythm  Patient Reassessment and Ultimate Disposition/Management Given exam findings, initially concern with opiate withdrawal.  Treated with clonidine, Bentyl, hydroxyzine and Zofran.  After treatment, tachycardia and blood pressure improved, and patient stated that he felt much better.  Patient states that he and his brother do have plans to go to rehab tomorrow.  I strongly encouraged him that that is an excellent plan and that the should certainly do so in the morning.  I sent clonidine and Zofran to his pharmacy.  Advised him to follow-up with his PCP as well for his opioid dependence and body tremors.  Otherwise electrolytes were not acutely deranged.   Unlikely that body shakes are seizure-like given there is no reported postictal phase and seem to be precipitated by stress and anxiety.  There could be a psychogenic component to this tremors.  Also considered infection but unlikely given afebrile and no leukocytosis.  Patient management required discussion with the following services or consulting groups:  None  Complexity of Problems Addressed Acute complicated illness or Injury  Additional Data Reviewed and Analyzed Further history obtained from: Further history from spouse/family member, Past medical history and medications listed in the EMR, and Prior ED visit notes  Patient Encounter Risk Assessment Prescriptions and Consideration of hospitalization         Final Clinical Impression(s) / ED Diagnoses Final diagnoses:  Opioid withdrawal ([place])    Rx / DC Orders ED Discharge Orders  Ordered    cloNIDine (CATAPRES) 0.2 MG tablet  2 times daily        01/30/23 1928    ondansetron (ZOFRAN) 4 MG tablet  Every 6 hours        01/30/23 1940              Harriet Pho, PA-C 01/30/23 1942    Fredia Sorrow, MD 01/31/23 [phone]

## 2023-01-30 NOTE — ED Triage Notes (Signed)
Pt presents POV from home w/family, ambulatory  Pt reports over 31 days of ongoing sinus infection and ear infection, completed a round of ABX. Pt reports ongoing uncontrollable all over body tremors. Only changes in his daily routine is Mucinex OTC and ABX

## 2023-01-30 NOTE — ED Notes (Signed)
Discharge paperwork given and verbally understood.
